# Patient Record
Sex: Female | Born: 1975 | Race: White | Hispanic: No | Marital: Married | Smoking: Former smoker
Health system: Southern US, Community
[De-identification: ages and names within clinical notes are randomized; demographics above are authoritative.]

## PROBLEM LIST (undated history)

## (undated) DIAGNOSIS — K219 Gastro-esophageal reflux disease without esophagitis: Secondary | ICD-10-CM

## (undated) DIAGNOSIS — B379 Candidiasis, unspecified: Secondary | ICD-10-CM

## (undated) DIAGNOSIS — Z8619 Personal history of other infectious and parasitic diseases: Secondary | ICD-10-CM

## (undated) DIAGNOSIS — Z9889 Other specified postprocedural states: Secondary | ICD-10-CM

## (undated) DIAGNOSIS — O409XX0 Polyhydramnios, unspecified trimester, not applicable or unspecified: Secondary | ICD-10-CM

## (undated) DIAGNOSIS — O3440 Maternal care for other abnormalities of cervix, unspecified trimester: Secondary | ICD-10-CM

## (undated) HISTORY — DX: Gastro-esophageal reflux disease without esophagitis: K21.9

## (undated) HISTORY — DX: Maternal care for other abnormalities of cervix, unspecified trimester: Z98.890

## (undated) HISTORY — DX: Maternal care for other abnormalities of cervix, unspecified trimester: O34.40

## (undated) HISTORY — DX: Personal history of other infectious and parasitic diseases: Z86.19

## (undated) HISTORY — DX: Polyhydramnios, unspecified trimester, not applicable or unspecified: O40.9XX0

## (undated) HISTORY — DX: Candidiasis, unspecified: B37.9

---

## 2002-02-08 HISTORY — PX: DILATION AND CURETTAGE OF UTERUS: SHX78

## 2003-03-12 HISTORY — PX: PILONIDAL CYST EXCISION: SHX744

## 2006-06-14 ENCOUNTER — Other Ambulatory Visit: Admission: RE | Admit: 2006-06-14 | Discharge: 2006-06-14 | Payer: Self-pay | Admitting: Obstetrics and Gynecology

## 2007-06-30 ENCOUNTER — Other Ambulatory Visit: Admission: RE | Admit: 2007-06-30 | Discharge: 2007-06-30 | Payer: Self-pay | Admitting: Obstetrics and Gynecology

## 2008-05-06 ENCOUNTER — Ambulatory Visit: Payer: Self-pay | Admitting: Family Medicine

## 2008-05-06 DIAGNOSIS — K219 Gastro-esophageal reflux disease without esophagitis: Secondary | ICD-10-CM

## 2008-05-06 DIAGNOSIS — K644 Residual hemorrhoidal skin tags: Secondary | ICD-10-CM | POA: Insufficient documentation

## 2008-05-09 LAB — CONVERTED CEMR LAB
AST: 26 units/L (ref 0–37)
Albumin: 4.3 g/dL (ref 3.5–5.2)
BUN: 11 mg/dL (ref 6–23)
Basophils Absolute: 0.1 10*3/uL (ref 0.0–0.1)
CO2: 29 meq/L (ref 19–32)
Chloride: 106 meq/L (ref 96–112)
Glucose, Bld: 100 mg/dL — ABNORMAL HIGH (ref 70–99)
HCT: 38.9 % (ref 36.0–46.0)
Hemoglobin: 13.3 g/dL (ref 12.0–15.0)
Lymphs Abs: 3.5 10*3/uL (ref 0.7–4.0)
MCHC: 34.3 g/dL (ref 30.0–36.0)
MCV: 90.6 fL (ref 78.0–100.0)
Monocytes Absolute: 1 10*3/uL (ref 0.1–1.0)
Monocytes Relative: 9.6 % (ref 3.0–12.0)
Neutro Abs: 5.4 10*3/uL (ref 1.4–7.7)
Platelets: 293 10*3/uL (ref 150.0–400.0)
Potassium: 3.9 meq/L (ref 3.5–5.1)
RDW: 12.4 % (ref 11.5–14.6)
Sodium: 141 meq/L (ref 135–145)
TSH: 3.81 microintl units/mL (ref 0.35–5.50)
Total Bilirubin: 0.5 mg/dL (ref 0.3–1.2)

## 2008-07-02 ENCOUNTER — Inpatient Hospital Stay (HOSPITAL_COMMUNITY): Admission: AD | Admit: 2008-07-02 | Discharge: 2008-07-02 | Payer: Self-pay | Admitting: Obstetrics and Gynecology

## 2008-09-24 ENCOUNTER — Encounter: Admission: RE | Admit: 2008-09-24 | Discharge: 2008-09-24 | Payer: Self-pay | Admitting: Obstetrics and Gynecology

## 2008-10-26 ENCOUNTER — Ambulatory Visit (HOSPITAL_COMMUNITY): Admission: AD | Admit: 2008-10-26 | Discharge: 2008-10-26 | Payer: Self-pay | Admitting: Obstetrics and Gynecology

## 2008-11-21 ENCOUNTER — Encounter (INDEPENDENT_AMBULATORY_CARE_PROVIDER_SITE_OTHER): Payer: Self-pay | Admitting: Plastic Surgery

## 2008-11-21 ENCOUNTER — Ambulatory Visit (HOSPITAL_BASED_OUTPATIENT_CLINIC_OR_DEPARTMENT_OTHER): Admission: RE | Admit: 2008-11-21 | Discharge: 2008-11-21 | Payer: Self-pay | Admitting: Plastic Surgery

## 2009-01-17 ENCOUNTER — Inpatient Hospital Stay (HOSPITAL_COMMUNITY): Admission: AD | Admit: 2009-01-17 | Discharge: 2009-01-22 | Payer: Self-pay | Admitting: Obstetrics and Gynecology

## 2009-03-03 ENCOUNTER — Ambulatory Visit: Admission: RE | Admit: 2009-03-03 | Discharge: 2009-03-03 | Payer: Self-pay | Admitting: Obstetrics and Gynecology

## 2009-06-16 ENCOUNTER — Ambulatory Visit: Payer: Self-pay | Admitting: Family Medicine

## 2009-06-16 DIAGNOSIS — M6740 Ganglion, unspecified site: Secondary | ICD-10-CM | POA: Insufficient documentation

## 2010-03-11 NOTE — Assessment & Plan Note (Signed)
Summary: ?cyst in rt wrist/causing discomfort/cjr   Vital Signs:  Patient profile:   35 year old female Weight:      225 pounds BMI:     35.90 BP sitting:   120 / 88  (left arm) Cuff size:   regular  Vitals Entered By: Raechel Ache, RN (Jun 16, 2009 3:45 PM) CC: C/o ?cyst R hand and whole hand hurts.   History of Present Illness: Here for a painful lump on the right wrist which has been present for about 3 months. It is slowly getting larger, and it restricts her wrist movements. No hx of trauma.   Allergies: No Known Drug Allergies  Past History:  Past Medical History: Reviewed history from 05/06/2008 and no changes required. Chickenpox GERD sees the [facility] for GYN exams sees Carma Leaven for dermatology exams  Past Surgical History: Reviewed history from 05/06/2008 and no changes required. D&C  Pilonidal cyst  Excision 03-2003  Review of Systems  The patient denies anorexia, fever, weight loss, weight gain, vision loss, decreased hearing, hoarseness, chest pain, syncope, dyspnea on exertion, peripheral edema, prolonged cough, headaches, hemoptysis, abdominal pain, melena, hematochezia, severe indigestion/heartburn, hematuria, incontinence, genital sores, muscle weakness, suspicious skin lesions, transient blindness, difficulty walking, depression, unusual weight change, abnormal bleeding, enlarged lymph nodes, angioedema, breast masses, and testicular masses.    Physical Exam  General:  Well-developed,well-nourished,in no acute distress; alert,appropriate and cooperative throughout examination Extremities:  the right dorsal wrist has a well defined round tender cyst    Impression & Recommendations:  Problem # 1:  GANGLION CYST (ICD-727.43)  Orders: Surgical Referral (Surgery)  Complete Medication List: 1)  Ortho-novum 7/7/7 (28) 0.5/0.75/1-35 Mg-mcg Tabs (Norethin-eth estrad triphasic)  Patient Instructions: 1)  we will refer to Hand Surgery to remove  this

## 2010-05-12 LAB — CBC
HCT: 39.2 % (ref 36.0–46.0)
Hemoglobin: 10.4 g/dL — ABNORMAL LOW (ref 12.0–15.0)
Hemoglobin: 13.1 g/dL (ref 12.0–15.0)
MCHC: 33.6 g/dL (ref 30.0–36.0)
MCV: 91.9 fL (ref 78.0–100.0)
Platelets: 206 10*3/uL (ref 150–400)
RBC: 3.38 MIL/uL — ABNORMAL LOW (ref 3.87–5.11)
RDW: 15.2 % (ref 11.5–15.5)
WBC: 11.1 10*3/uL — ABNORMAL HIGH (ref 4.0–10.5)
WBC: 17.6 10*3/uL — ABNORMAL HIGH (ref 4.0–10.5)

## 2010-05-15 LAB — ANTIBODY SCREEN: Antibody Screen: NEGATIVE

## 2010-05-15 LAB — RH IMMUNE GLOBULIN WORKUP (NOT WOMEN'S HOSP): ABO/RH(D): A NEG

## 2010-09-01 ENCOUNTER — Encounter: Payer: Self-pay | Admitting: Family Medicine

## 2010-09-02 ENCOUNTER — Ambulatory Visit (INDEPENDENT_AMBULATORY_CARE_PROVIDER_SITE_OTHER): Payer: BC Managed Care – PPO | Admitting: Family Medicine

## 2010-09-02 ENCOUNTER — Encounter: Payer: Self-pay | Admitting: Family Medicine

## 2010-09-02 VITALS — BP 118/88 | HR 91 | Temp 98.5°F | Wt 234.0 lb

## 2010-09-02 DIAGNOSIS — L219 Seborrheic dermatitis, unspecified: Secondary | ICD-10-CM

## 2010-09-02 NOTE — Progress Notes (Signed)
  Subjective:    Patient ID: Cathy Pearson, female    DOB: [birth year], 35 y.o.   MRN: [id number]  HPI Here for an itchy rash that comes and goes on her scalp. This has been going on for about a year. She does not use medicated shampoos. Her scalp gets red at times and flakes, then it seems to clear up. No other areas on her skin are involved.    Review of Systems  Constitutional: Negative.   Skin: Positive for rash.       Objective:   Physical Exam  Constitutional: She appears well-developed and well-nourished.  Skin: Skin is warm. No rash noted. No erythema. No pallor.       Her scalp is clear          Assessment & Plan:  This sounds like it may be seborrhea. Suggested she try Selsun Blue shampoo. Recheck prn

## 2011-03-10 ENCOUNTER — Encounter: Payer: Self-pay | Admitting: *Deleted

## 2011-03-16 ENCOUNTER — Ambulatory Visit (INDEPENDENT_AMBULATORY_CARE_PROVIDER_SITE_OTHER): Payer: BC Managed Care – PPO | Admitting: Endocrinology

## 2011-03-16 ENCOUNTER — Encounter: Payer: Self-pay | Admitting: Endocrinology

## 2011-03-16 ENCOUNTER — Other Ambulatory Visit (INDEPENDENT_AMBULATORY_CARE_PROVIDER_SITE_OTHER): Payer: BC Managed Care – PPO

## 2011-03-16 VITALS — BP 122/70 | HR 62 | Temp 98.6°F | Ht 67.0 in | Wt 238.0 lb

## 2011-03-16 DIAGNOSIS — R7989 Other specified abnormal findings of blood chemistry: Secondary | ICD-10-CM

## 2011-03-16 DIAGNOSIS — E785 Hyperlipidemia, unspecified: Secondary | ICD-10-CM

## 2011-03-16 DIAGNOSIS — R799 Abnormal finding of blood chemistry, unspecified: Secondary | ICD-10-CM

## 2011-03-16 DIAGNOSIS — E079 Disorder of thyroid, unspecified: Secondary | ICD-10-CM | POA: Insufficient documentation

## 2011-03-16 NOTE — Progress Notes (Signed)
Subjective:    Patient ID: Cathy Pearson, female    DOB: 05/19/1975, 36 y.o.   MRN: [id number]  HPI Pt states few years of slight hair loss throughout the head, and assoc fatigue.  She says thyroid levels have been normal to slightly abnormal in the past.   She says she is not at risk for another pregnancy. Past Medical History  Diagnosis Date  . GERD (gastroesophageal reflux disease)     Past Surgical History  Procedure Date  . Dilation and curettage of uterus   . Pilonidal cyst excision 03-2003    History   Social History  . Marital Status: Married    Spouse Name: N/A    Number of Children: N/A  . Years of Education: N/A   Occupational History  . Not on file.   Social History Main Topics  . Smoking status: Former Games developer  . Smokeless tobacco: Not on file  . Alcohol Use: Yes  . Drug Use: No  . Sexually Active:    Other Topics Concern  . Not on file   Social History Narrative   Regular exercise-yes    Current Outpatient Prescriptions on File Prior to Visit  Medication Sig Dispense Refill  . norethindrone-ethinyl estradiol (TRIPHASIL) 0.5/0.75/1-35 MG-MCG per tablet Take 1 tablet by mouth daily.          No Known Allergies  Family History  Problem Relation Age of Onset  . Alcohol abuse      fhx  . Cancer      fhx/breast, lung, uterine  . Coronary artery disease      fhx  . Depression      fhx  . Diabetes      fhx  . Hypertension      fhx  . Hyperlipidemia      fhx  . Kidney disease      fhx  . Thyroid disease      fhx  hashimoto's thyroiditis:  Father and multiple aunts/uncles  BP 122/70  Pulse 62  Temp(Src) 98.6 F (37 C) (Oral)  Ht 5\' 7"  (1.702 m)  Wt 238 lb (107.956 kg)  BMI 37.28 kg/m2  SpO2 99%  Review of Systems denies cramps, sob, memory loss, numbness, blurry vision, dry skin, rhinorrhea, and syncope.  She reports irritability, depression, constipation, myalgias, dry skin, easy bruising, and weight gain.      Objective:   Physical Exam VS: see vs page GEN: no distress HEAD: head: no deformity eyes: no periorbital swelling, no proptosis external nose and ears are normal mouth: no lesion seen NECK: supple, thyroid is not enlarged CHEST WALL: no deformity LUNGS:  Clear to auscultation CV: reg rate and rhythm, no murmur ABD: abdomen is soft, nontender.  no hepatosplenomegaly.  not distended.  no hernia. MUSCULOSKELETAL: muscle bulk and strength are grossly normal.  no obvious joint swelling.  gait is normal and steady EXTEMITIES: no deformity of the hands.  no edema of the legs NEURO:  cn 2-12 grossly intact.   readily moves all 4's.  sensation is intact to touch on all 4's. No tremor. SKIN:  Normal texture and temperature.  No rash or suspicious lesion is visible.   NODES:  None palpable at the neck. PSYCH: alert, oriented x3.  Does not appear anxious nor depressed.   Lab Results  Component Value Date   TSH 2.50 03/16/2011   Lab Results  Component Value Date   CHOL 255* 03/16/2011   HDL 52.40 03/16/2011   LDLDIRECT 175.9 03/16/2011  TRIG 165.0* 03/16/2011   CHOLHDL 5 03/16/2011   Lab Results  Component Value Date   HGBA1C 5.4 03/16/2011      Assessment & Plan:  Apparent h/o mild hypothyroidism, better now.  However, she is at risk for hypothyroidism in the future Hair loss, not thyroid-related Dyslipidemia, new

## 2011-03-16 NOTE — Patient Instructions (Addendum)
cc Dr Stefano Gaul.  blood tests are being requested for you today.  please call [phone] to hear your test results.  You will be prompted to enter the 9-digit "MRN" number that appears at the top left of this page, followed by #.  Then you will hear the message.   (update: i left message on phone-tree:  You should have tsh rechecked in 1 year.  i advised chol med)

## 2011-03-17 DIAGNOSIS — E785 Hyperlipidemia, unspecified: Secondary | ICD-10-CM | POA: Insufficient documentation

## 2011-03-17 LAB — LIPID PANEL
HDL: 52.4 mg/dL (ref >39.00)
Total CHOL/HDL Ratio: 5
Triglycerides: 165 mg/dL — ABNORMAL HIGH (ref 0.0–149.0)
VLDL: 33 mg/dL (ref 0.0–40.0)

## 2011-03-17 LAB — LDL CHOLESTEROL, DIRECT: Direct LDL: 175.9 mg/dL

## 2011-08-26 ENCOUNTER — Ambulatory Visit (INDEPENDENT_AMBULATORY_CARE_PROVIDER_SITE_OTHER): Payer: 59 | Admitting: Obstetrics and Gynecology

## 2011-08-26 ENCOUNTER — Encounter: Payer: Self-pay | Admitting: Obstetrics and Gynecology

## 2011-08-26 VITALS — BP 126/64 | Resp 14 | Ht 68.0 in | Wt 238.0 lb

## 2011-08-26 DIAGNOSIS — Z124 Encounter for screening for malignant neoplasm of cervix: Secondary | ICD-10-CM

## 2011-08-26 DIAGNOSIS — Z01419 Encounter for gynecological examination (general) (routine) without abnormal findings: Secondary | ICD-10-CM

## 2011-08-26 DIAGNOSIS — E669 Obesity, unspecified: Secondary | ICD-10-CM | POA: Insufficient documentation

## 2011-08-26 NOTE — Progress Notes (Signed)
Regular Periods: yes Mammogram: no  Monthly Breast Ex.: yes Exercise: yes  Tetanus < 10 years: no Seatbelts: yes  NI. Bladder Functn.: yes Abuse at home: no  Daily BM's: yes Stressful Work: no  Healthy Diet: yes Sigmoid-Colonoscopy: N/A  Calcium: no Medical problems this year: N/A   LAST PAP:06/2008" WNL"  Contraception: Ortho- Novum  Mammogram:  N/A  PCP: Dr.Steven Abran Cantor  PMH: No Changes  FMH: No Changes  Last Bone Scan: N/A  ANNUAL GYNECOLOGIC EXAMINATION   Cathy Pearson is a 36 y.o. female, G1P1001, who presents for an annual exam. See above. Doing well.  Prior Hysterectomy: No    History   Social History  . Marital Status: Married    Spouse Name: N/A    Number of Children: N/A  . Years of Education: N/A   Social History Main Topics  . Smoking status: Former Games developer  . Smokeless tobacco: Never Used  . Alcohol Use: Yes  . Drug Use: No  . Sexually Active: yes   Other Topics Concern  . None   Social History Narrative   Regular exercise-yes    Menstrual cycle:   LMP: Patient's last menstrual period was 08/08/2011.           Cycle: Regular, monthly with normal flow and no severe dysmenorrha  The following portions of the patient's history were reviewed and updated as appropriate: allergies, current medications, past family history, past medical history, past social history, past surgical history and problem list.  Review of Systems Pertinent items are noted in HPI. Breast:Negative for breast lump,nipple discharge or nipple retraction Gastrointestinal: Negative for abdominal pain, change in bowel habits or rectal bleeding Urinary:negative   Objective:    BP 126/64  Resp 14  Ht 5\' 8"  (1.727 m)  Wt 238 lb (107.956 kg)  BMI 36.19 kg/m2  LMP 08/08/2011    Weight:  Wt Readings from Last 1 Encounters:  08/26/11 238 lb (107.956 kg)          BMI: Body mass index is 36.19 kg/(m^2).  General Appearance: Alert, appropriate appearance for age. No acute  distress HEENT: Grossly normal Neck / Thyroid: Supple, no masses, nodes or enlargement Lungs: clear to auscultation bilaterally Back: No CVA tenderness Breast Exam: No masses or nodes.No dimpling, nipple retraction or discharge. Cardiovascular: Regular rate and rhythm. S1, S2, no murmur Gastrointestinal: Soft, non-tender, no masses or organomegaly  ++++++++++++++++++++++++++++++++++++++++++++++++++++++++  Pelvic Exam: External genitalia: normal general appearance Vaginal: normal without tenderness, induration or masses and relaxation: Yes Cervix: normal appearance Adnexa: normal bimanual exam Uterus: normal size, shape, and consistency Rectovaginal: normal rectal, no masses  ++++++++++++++++++++++++++++++++++++++++++++++++++++++++  Lymphatic Exam: Non-palpable nodes in neck, clavicular, axillary, or inguinal regions Neurologic: Normal speech, no tremor  Psychiatric: Alert and oriented, appropriate affect.   Wet Prep:   not applicable Urinalysis:  not applicable UPT:           Not done   Assessment:    Normal gyn exam   Overweight or obese: Yes   Pelvic relaxation: Yes     Plan:    pap smear return annually or prn Contraception:vasectomy    Medications prescribed: none  STD screen request: none  RPR: No.   HBsAg: No.  Hepatitis C: No.  The updated Pap smear screening guidelines were discussed with the patient. The patient requested that I obtain a Pap smear: Yes.  Kegel exercises discussed: Yes.  Proper diet and regular exercise were reviewed.  Annual mammograms recommended starting at age 98. Proper  breast care was discussed.  Screening colonoscopy is recommended beginning at age 67.  Regular health maintenance was reviewed.  Sleep hygiene was discussed.  Adequate calcium and vitamin D intake was emphasized.  Mylinda Latina.D.

## 2012-04-20 ENCOUNTER — Ambulatory Visit (INDEPENDENT_AMBULATORY_CARE_PROVIDER_SITE_OTHER): Payer: 59 | Admitting: Family Medicine

## 2012-04-20 DIAGNOSIS — Z23 Encounter for immunization: Secondary | ICD-10-CM

## 2013-03-13 ENCOUNTER — Encounter: Payer: Self-pay | Admitting: Family Medicine

## 2013-03-13 ENCOUNTER — Ambulatory Visit (INDEPENDENT_AMBULATORY_CARE_PROVIDER_SITE_OTHER): Payer: 59 | Admitting: Family Medicine

## 2013-03-13 VITALS — BP 106/70 | HR 63 | Temp 98.6°F | Ht 68.0 in | Wt 232.0 lb

## 2013-03-13 DIAGNOSIS — K644 Residual hemorrhoidal skin tags: Secondary | ICD-10-CM

## 2013-03-13 DIAGNOSIS — H0289 Other specified disorders of eyelid: Secondary | ICD-10-CM

## 2013-03-13 MED ORDER — HYDROCORTISONE 2.5 % RE CREA: 1.0000 "application " | TOPICAL_CREAM | Freq: Two times a day (BID) | RECTAL | Status: DC

## 2013-03-13 MED ORDER — NEOMYCIN-POLYMYXIN-HC 3.5-10000-1 OP SUSP: 3.0000 [drp] | Freq: Four times a day (QID) | OPHTHALMIC | Status: DC

## 2013-03-13 NOTE — Progress Notes (Signed)
   Subjective:    Patient ID: Alleen Borneharity Cuello, female    DOB: Jul 02, 1975, 38 y.o.   MRN: [id number]  HPI Here for 2 things. First she asks for a cream to sooth her hemorrhoids. Second for the past 10 days she has had irritation with some redness in the corner of the right eye. No DC and no eyelids sticking together. Her vision is okay. She does not wear contact lenses. No cold or URI sx.    Review of Systems  Constitutional: Negative.   Eyes: Positive for pain and redness. Negative for discharge and visual disturbance.  Respiratory: Negative.        Objective:   Physical Exam  Constitutional: She appears well-developed and well-nourished.  HENT:  Right Ear: External ear normal.  Left Ear: External ear normal.  Nose: Nose normal.  Mouth/Throat: Oropharynx is clear and moist.  Eyes: Conjunctivae are normal. Pupils are equal, round, and reactive to light. Right eye exhibits no discharge. Left eye exhibits discharge.  The nasal corner of the right eye shows some erythema, no swelling or DC          Assessment & Plan:  Eye irritation, possibly due to a loose eyelash. Try Cortisporin drops. Try Anusol HC for the hemorrhoids .

## 2013-03-13 NOTE — Progress Notes (Signed)
Pre visit review using our clinic review tool, if applicable. No additional management support is needed unless otherwise documented below in the visit note. 

## 2013-11-01 ENCOUNTER — Ambulatory Visit (INDEPENDENT_AMBULATORY_CARE_PROVIDER_SITE_OTHER): Payer: BC Managed Care – PPO | Admitting: Physician Assistant

## 2013-11-01 ENCOUNTER — Encounter: Payer: Self-pay | Admitting: Physician Assistant

## 2013-11-01 VITALS — BP 110/80 | HR 93 | Temp 99.1°F | Resp 18 | Wt 251.8 lb

## 2013-11-01 DIAGNOSIS — J209 Acute bronchitis, unspecified: Secondary | ICD-10-CM

## 2013-11-01 MED ORDER — AZITHROMYCIN 250 MG PO TABS: ORAL_TABLET | ORAL | Status: DC

## 2013-11-01 NOTE — Patient Instructions (Addendum)
Plain Over the Counter Mucinex (NOT Mucinex D) for thick secretions  Force NON dairy fluids, drinking plenty of water is best.    Over the Counter Flonase OR Nasacort AQ 1 spray in each nostril twice a day as needed. Use the "crossover" technique into opposite nostril spraying toward opposite ear @ 45 degree angle, not straight up into nostril.   Plain Over the Counter Allegra (NOT D )  160 daily , OR Loratidine 10 mg , OR Zyrtec 10 mg @ bedtime  as needed for itchy eyes & sneezing.  Saline Irrigation and Saline Sprays can also help reduce symptoms.  Azithromycin as directed if your symptoms do not improve through the weekend despite symptomatic treatment.  If emergency symptoms discussed during visit developed, seek medical attention immediately.  Followup as needed, or for worsening or persistent symptoms despite treatment.    Upper Respiratory Infection, Adult An upper respiratory infection (URI) is also known as the common cold. It is often caused by a type of germ (virus). Colds are easily spread (contagious). You can pass it to others by kissing, coughing, sneezing, or drinking out of the same glass. Usually, you get better in 1 or 2 weeks.  HOME CARE   Only take medicine as told by your doctor.  Use a warm mist humidifier or breathe in steam from a hot shower.  Drink enough water and fluids to keep your pee (urine) clear or pale yellow.  Get plenty of rest.  Return to work when your temperature is back to normal or as told by your doctor. You may use a face mask and wash your hands to stop your cold from spreading. GET HELP RIGHT AWAY IF:   After the first few days, you feel you are getting worse.  You have questions about your medicine.  You have chills, shortness of breath, or brown or red spit (mucus).  You have yellow or brown snot (nasal discharge) or pain in the face, especially when you bend forward.  You have a fever, puffy (swollen) neck, pain when you  swallow, or white spots in the back of your throat.  You have a bad headache, ear pain, sinus pain, or chest pain.  You have a high-pitched whistling sound when you breathe in and out (wheezing).  You have a lasting cough or cough up blood.  You have sore muscles or a stiff neck. MAKE SURE YOU:   Understand these instructions.  Will watch your condition.  Will get help right away if you are not doing well or get worse. Document Released: 07/14/2007 Document Revised: 04/19/2011 Document Reviewed: 05/02/2013 [facility] Patient Information 2015 [place], [place]. This information is not intended to replace advice given to you by your health care provider. Make sure you discuss any questions you have with your health care provider.

## 2013-11-01 NOTE — Progress Notes (Signed)
Subjective:    Patient ID: Cathy Pearson, female    DOB: 1975-11-20, 38 y.o.   MRN: [id number]  Cough This is a new problem. The current episode started in the past 7 days (2 days). The problem has been gradually worsening. The problem occurs every few minutes. The cough is productive of sputum. Associated symptoms include chills, a fever, headaches, nasal congestion, postnasal drip and a sore throat. Pertinent negatives include no chest pain, ear congestion, ear pain, heartburn, hemoptysis, myalgias, rash, rhinorrhea, shortness of breath, sweats, weight loss or wheezing. Nothing aggravates the symptoms. Treatments tried: mucinex, delsym, nyquil. The treatment provided mild relief. There is no history of asthma, COPD or environmental allergies.      Review of Systems  Constitutional: Positive for fever and chills. Negative for weight loss.  HENT: Positive for congestion, postnasal drip, sinus pressure and sore throat. Negative for ear pain and rhinorrhea.   Respiratory: Positive for cough. Negative for hemoptysis, shortness of breath and wheezing.   Cardiovascular: Negative for chest pain.  Gastrointestinal: Negative for heartburn, nausea, vomiting and diarrhea.  Musculoskeletal: Negative for myalgias.  Skin: Negative for rash.  Allergic/Immunologic: Negative for environmental allergies.  Neurological: Positive for headaches. Negative for syncope.  All other systems reviewed and are negative.    Past Medical History  Diagnosis Date  . GERD (gastroesophageal reflux disease)   . History of chicken pox   . Yeast infection   . Polyhydramnios   . H/O LEEP (loop electrosurgical excision procedure) of cervix complicating pregnancy     History   Social History  . Marital Status: Married    Spouse Name: N/A    Number of Children: N/A  . Years of Education: N/A   Occupational History  . Not on file.   Social History Main Topics  . Smoking status: Former Games developer  . Smokeless  tobacco: Never Used  . Alcohol Use: Yes     Comment: occ  . Drug Use: No  . Sexual Activity: Not on file   Other Topics Concern  . Not on file   Social History Narrative   Regular exercise-yes    Past Surgical History  Procedure Laterality Date  . Dilation and curettage of uterus  2004  . Pilonidal cyst excision  03-2003    Family History  Problem Relation Age of Onset  . Alcohol abuse      fhx  . Cancer      fhx/breast, lung, uterine  . Coronary artery disease      fhx  . Depression      fhx  . Diabetes      fhx  . Hypertension      fhx  . Hyperlipidemia      fhx  . Kidney disease      fhx  . Thyroid disease      fhx    No Known Allergies  Current Outpatient Prescriptions on File Prior to Visit  Medication Sig Dispense Refill  . hydrocortisone (ANUSOL-HC) 2.5 % rectal cream Place 1 application rectally 2 (two) times daily.  30 g  5  . neomycin-polymyxin-hydrocortisone (CORTISPORIN) 3.5-10000-1 ophthalmic suspension Place 3 drops into the right eye 4 (four) times daily.  7.5 mL  0   No current facility-administered medications on file prior to visit.    EXAM: BP 110/80  Pulse 93  Temp(Src) 99.1 F (37.3 C) (Oral)  Resp 18  Wt 251 lb 12.8 oz (114.216 kg)  SpO2 98%  Objective:   Physical Exam  Nursing note and vitals reviewed. Constitutional: She is oriented to person, place, and time. She appears well-developed and well-nourished. No distress.  HENT:  Head: Normocephalic and atraumatic.  Right Ear: External ear normal.  Left Ear: External ear normal.  Nose: Nose normal.  Mouth/Throat: Oropharynx is clear and moist. No oropharyngeal exudate.  Bilateral TMs normal. Bilateral frontal and maxillary sinuses non-TTP.  Eyes: Conjunctivae and EOM are normal. Pupils are equal, round, and reactive to light.  Neck: Normal range of motion. Neck supple.  Cardiovascular: Normal rate, regular rhythm and intact distal pulses.   Pulmonary/Chest: Effort  normal and breath sounds normal. No stridor. No respiratory distress. She has no wheezes. She has no rales. She exhibits no tenderness.  Lymphadenopathy:    She has no cervical adenopathy.  Neurological: She is alert and oriented to person, place, and time.  Skin: Skin is warm and dry. No rash noted. She is not diaphoretic. No erythema. No pallor.  Psychiatric: She has a normal mood and affect. Her behavior is normal. Judgment and thought content normal.    Lab Results  Component Value Date   WBC 17.6* 01/20/2009   HGB 10.4* 01/20/2009   HCT 31.0* 01/20/2009   PLT 158 01/20/2009   GLUCOSE 100* 05/06/2008   CHOL 255* 03/16/2011   TRIG 165.0* 03/16/2011   HDL 52.40 03/16/2011   LDLDIRECT 175.9 03/16/2011   ALT 33 05/06/2008   AST 26 05/06/2008   NA 141 05/06/2008   K 3.9 05/06/2008   CL 106 05/06/2008   CREATININE 0.8 05/06/2008   BUN 11 05/06/2008   CO2 29 05/06/2008   TSH 2.50 03/16/2011   HGBA1C 5.4 03/16/2011         Assessment & Plan:  Cathy Pearson was seen today for cough.  Diagnoses and associated orders for this visit:  Acute bronchitis, unspecified organism Comments: Symptomatic treatment with OTC Mucinex, nasal steroid, antihistamine, rest, push fluids, watchful waiting. - Discontinue: azithromycin (ZITHROMAX) 250 MG tablet; 2 tabs on first day, followed by 1 tab on days 2 through 5. - azithromycin (ZITHROMAX) 250 MG tablet; 2 tabs on first day, followed by 1 tab on days 2 through 5.   Discussed with patient that antibiotics were likely not indicated, that her condition was likely viral, and that an antibiotic would not likely help. The patient still felt as though an antibiotic would be good to have in case he is not better by the weekend. Provided a printed Rx of a Z-Pak for patient to refill this weekend if she is still not better despite symptomatic treatment. The patient is amenable to this plan.  Return precautions provided, and patient handout on URI.  Plan to follow up as  needed, or for worsening or persistent symptoms despite treatment.  Patient Instructions  Plain Over the Counter Mucinex (NOT Mucinex D) for thick secretions  Force NON dairy fluids, drinking plenty of water is best.    Over the Counter Flonase OR Nasacort AQ 1 spray in each nostril twice a day as needed. Use the "crossover" technique into opposite nostril spraying toward opposite ear @ 45 degree angle, not straight up into nostril.   Plain Over the Counter Allegra (NOT D )  160 daily , OR Loratidine 10 mg , OR Zyrtec 10 mg @ bedtime  as needed for itchy eyes & sneezing.  Saline Irrigation and Saline Sprays can also help reduce symptoms.  Azithromycin as directed if your symptoms do not improve through  the weekend despite symptomatic treatment.  If emergency symptoms discussed during visit developed, seek medical attention immediately.  Followup as needed, or for worsening or persistent symptoms despite treatment.

## 2013-11-01 NOTE — Progress Notes (Signed)
Pre visit review using our clinic review tool, if applicable. No additional management support is needed unless otherwise documented below in the visit note. 

## 2013-12-10 ENCOUNTER — Encounter: Payer: Self-pay | Admitting: Physician Assistant

## 2014-09-10 ENCOUNTER — Encounter: Payer: Self-pay | Admitting: Family Medicine

## 2014-09-10 ENCOUNTER — Ambulatory Visit (INDEPENDENT_AMBULATORY_CARE_PROVIDER_SITE_OTHER): Payer: 59 | Admitting: Family Medicine

## 2014-09-10 VITALS — BP 113/77 | HR 72 | Temp 98.8°F | Ht 68.0 in | Wt 253.0 lb

## 2014-09-10 DIAGNOSIS — Z23 Encounter for immunization: Secondary | ICD-10-CM | POA: Diagnosis not present

## 2014-09-10 DIAGNOSIS — Z0000 Encounter for general adult medical examination without abnormal findings: Secondary | ICD-10-CM

## 2014-09-10 NOTE — Progress Notes (Signed)
   Subjective:    Patient ID: Cathy Pearson, female    DOB: May 05, 1975, 39 y.o.   MRN: [id number]  HPI 39 yr old female for a work related physical. She will work for [employer] in the [employer] center at [employer]. She feels well and has no concerns. She sees her GYN yearly.    Review of Systems  Constitutional: Negative.   HENT: Negative.   Eyes: Negative.   Respiratory: Negative.   Cardiovascular: Negative.   Gastrointestinal: Negative.   Genitourinary: Negative for dysuria, urgency, frequency, hematuria, flank pain, decreased urine volume, enuresis, difficulty urinating, pelvic pain and dyspareunia.  Musculoskeletal: Negative.   Skin: Negative.   Neurological: Negative.   Psychiatric/Behavioral: Negative.        Objective:   Physical Exam  Constitutional: She is oriented to person, place, and time. She appears well-developed and well-nourished. No distress.  HENT:  Head: Normocephalic and atraumatic.  Right Ear: External ear normal.  Left Ear: External ear normal.  Nose: Nose normal.  Mouth/Throat: Oropharynx is clear and moist. No oropharyngeal exudate.  Eyes: Conjunctivae and EOM are normal. Pupils are equal, round, and reactive to light. No scleral icterus.  Neck: Normal range of motion. Neck supple. No JVD present. No thyromegaly present.  Cardiovascular: Normal rate, regular rhythm, normal heart sounds and intact distal pulses.  Exam reveals no gallop and no friction rub.   No murmur heard. Pulmonary/Chest: Effort normal and breath sounds normal. No respiratory distress. She has no wheezes. She has no rales. She exhibits no tenderness.  Abdominal: Soft. Bowel sounds are normal. She exhibits no distension and no mass. There is no tenderness. There is no rebound and no guarding.  Musculoskeletal: Normal range of motion. She exhibits no edema or tenderness.  Lymphadenopathy:    She has no cervical adenopathy.  Neurological: She is alert and  oriented to person, place, and time. She has normal reflexes. No cranial nerve deficit. She exhibits normal muscle tone. Coordination normal.  Skin: Skin is warm and dry. No rash noted. No erythema.  Psychiatric: She has a normal mood and affect. Her behavior is normal. Judgment and thought content normal.          Assessment & Plan:  Well exam. We discussed diet and exercise advice, and I urged her to lose some weight. She is cleared to work, pending the result of her PPD test.

## 2014-09-10 NOTE — Progress Notes (Signed)
Pre visit review using our clinic review tool, if applicable. No additional management support is needed unless otherwise documented below in the visit note. 

## 2014-09-12 LAB — TB SKIN TEST
INDURATION: 0 mm
TB Skin Test: NEGATIVE

## 2016-04-20 ENCOUNTER — Ambulatory Visit (INDEPENDENT_AMBULATORY_CARE_PROVIDER_SITE_OTHER): Payer: BC Managed Care – PPO | Admitting: Family Medicine

## 2016-04-20 ENCOUNTER — Encounter: Payer: Self-pay | Admitting: Family Medicine

## 2016-04-20 VITALS — BP 125/83 | HR 63 | Temp 98.4°F | Ht 68.0 in | Wt 248.0 lb

## 2016-04-20 DIAGNOSIS — M5432 Sciatica, left side: Secondary | ICD-10-CM

## 2016-04-20 MED ORDER — METHYLPREDNISOLONE 4 MG PO TBPK: ORAL_TABLET | ORAL | 0 refills | Status: DC

## 2016-04-20 MED ORDER — CYCLOBENZAPRINE HCL 10 MG PO TABS: 10.0000 mg | ORAL_TABLET | Freq: Three times a day (TID) | ORAL | 2 refills | Status: DC | PRN

## 2016-04-20 NOTE — Progress Notes (Signed)
Pre visit review using our clinic review tool, if applicable. No additional management support is needed unless otherwise documented below in the visit note. 

## 2016-04-20 NOTE — Patient Instructions (Signed)
WE NOW OFFER   [facility] FAST TRACK!!!  SAME DAY Appointments for ACUTE CARE  Such as: Sprains, Injuries, cuts, abrasions, rashes, muscle pain, joint pain, back pain Colds, flu, sore throats, headache, allergies, cough, fever  Ear pain, sinus and eye infections Abdominal pain, nausea, vomiting, diarrhea, upset stomach Animal/insect bites  3 Easy Ways to Schedule: Walk-In Scheduling Call in scheduling Mychart Sign-up: [email or web address]         

## 2016-04-20 NOTE — Progress Notes (Signed)
   Subjective:    Patient ID: Cathy Pearson, female    DOB: 1975-07-08, 41 y.o.   MRN: [id number]  HPI Here for left sided low back pain that started 3 months ago. At first it was intermittent but it now occurs daily. It also radiates through the left buttock and down the left leg. No weakness or numbness. Using Ibuprofen.    Review of Systems  Constitutional: Negative.   Musculoskeletal: Positive for back pain. Negative for arthralgias and gait problem.       Objective:   Physical Exam  Constitutional: She appears well-developed and well-nourished. No distress.  Musculoskeletal:  She is tender in the left lower back and over the left sciatic notch. SLR is positive on the left. ROM is full.           Assessment & Plan:  Sciatica, use moist heat and stretches. Given a Medrol dose pack and Flexeril. Recheck prn. Gershon CraneStephen Fry, MD

## 2016-04-21 DIAGNOSIS — M5432 Sciatica, left side: Secondary | ICD-10-CM | POA: Insufficient documentation

## 2016-05-05 ENCOUNTER — Telehealth: Payer: Self-pay | Admitting: Family Medicine

## 2016-05-05 DIAGNOSIS — M5440 Lumbago with sciatica, unspecified side: Principal | ICD-10-CM

## 2016-05-05 DIAGNOSIS — G8929 Other chronic pain: Secondary | ICD-10-CM

## 2016-05-05 NOTE — Telephone Encounter (Signed)
I put in orders for her to go to [facility] to have Xrays of the lumbar spine done

## 2016-05-05 NOTE — Telephone Encounter (Signed)
Pt was seen on 04-20-16 and muscle relaxer is not help her. [facility] [address]

## 2016-05-06 NOTE — Telephone Encounter (Signed)
I left a voice message with information and also advised pt to return our call.

## 2016-05-10 ENCOUNTER — Ambulatory Visit (INDEPENDENT_AMBULATORY_CARE_PROVIDER_SITE_OTHER)
Admission: RE | Admit: 2016-05-10 | Discharge: 2016-05-10 | Disposition: A | Discharge status: Home / Self Care | Payer: BC Managed Care – PPO | Source: Ambulatory Visit | Attending: Family Medicine | Admitting: Family Medicine

## 2016-05-10 DIAGNOSIS — M5440 Lumbago with sciatica, unspecified side: Secondary | ICD-10-CM

## 2016-05-10 DIAGNOSIS — G8929 Other chronic pain: Secondary | ICD-10-CM | POA: Diagnosis not present

## 2016-05-10 NOTE — Telephone Encounter (Signed)
I spoke pt and she did get xray done, we are waiting on results.

## 2016-05-12 ENCOUNTER — Other Ambulatory Visit: Payer: Self-pay | Admitting: Family Medicine

## 2016-05-12 MED ORDER — DICLOFENAC SODIUM 75 MG PO TBEC: 75.0000 mg | DELAYED_RELEASE_TABLET | Freq: Two times a day (BID) | ORAL | 5 refills | Status: DC | PRN

## 2016-05-12 MED ORDER — DICLOFENAC SODIUM 75 MG PO TBEC: 75.0000 mg | DELAYED_RELEASE_TABLET | Freq: Two times a day (BID) | ORAL | 5 refills | Status: DC

## 2017-04-20 ENCOUNTER — Telehealth: Payer: Self-pay

## 2017-04-20 NOTE — Telephone Encounter (Signed)
Call in #60 with no rf. She will need an OV soon

## 2017-04-20 NOTE — Telephone Encounter (Signed)
Fax from [facility] [address]   RF request for 90 day supply of diclofenac SOD EC 75 MG   Last OV 04/20/2016   Last refilled 05/12/2016 disp 60 with 5 refills   Sent to PCP for approval

## 2017-04-20 NOTE — Telephone Encounter (Signed)
Called and spoke with pt. Pt stated that she didn't need the medication no need to send this in. Ok to close.

## 2017-04-28 ENCOUNTER — Telehealth: Payer: Self-pay

## 2017-04-28 NOTE — Telephone Encounter (Signed)
Call in #60 with 5 rf 

## 2017-04-28 NOTE — Telephone Encounter (Signed)
Fax from [facility] [address]   Rf request for   Diclofenac 75 MG tablet   Last OV 04/20/2016    Last refilled 05/12/2016 disp 60 with 5 refills   Sent to PCP for approval

## 2017-04-29 MED ORDER — DICLOFENAC SODIUM 75 MG PO TBEC: 75.0000 mg | DELAYED_RELEASE_TABLET | Freq: Two times a day (BID) | ORAL | 5 refills | Status: DC | PRN

## 2017-08-13 IMAGING — DX DG LUMBAR SPINE COMPLETE 4+V
5 series · 5 of 5 positions shown · non-contrast
Comparison: None.

CLINICAL DATA: Chronic low back pain

EXAM:
LUMBAR SPINE - COMPLETE 4+ VIEW

[l-spine ap]
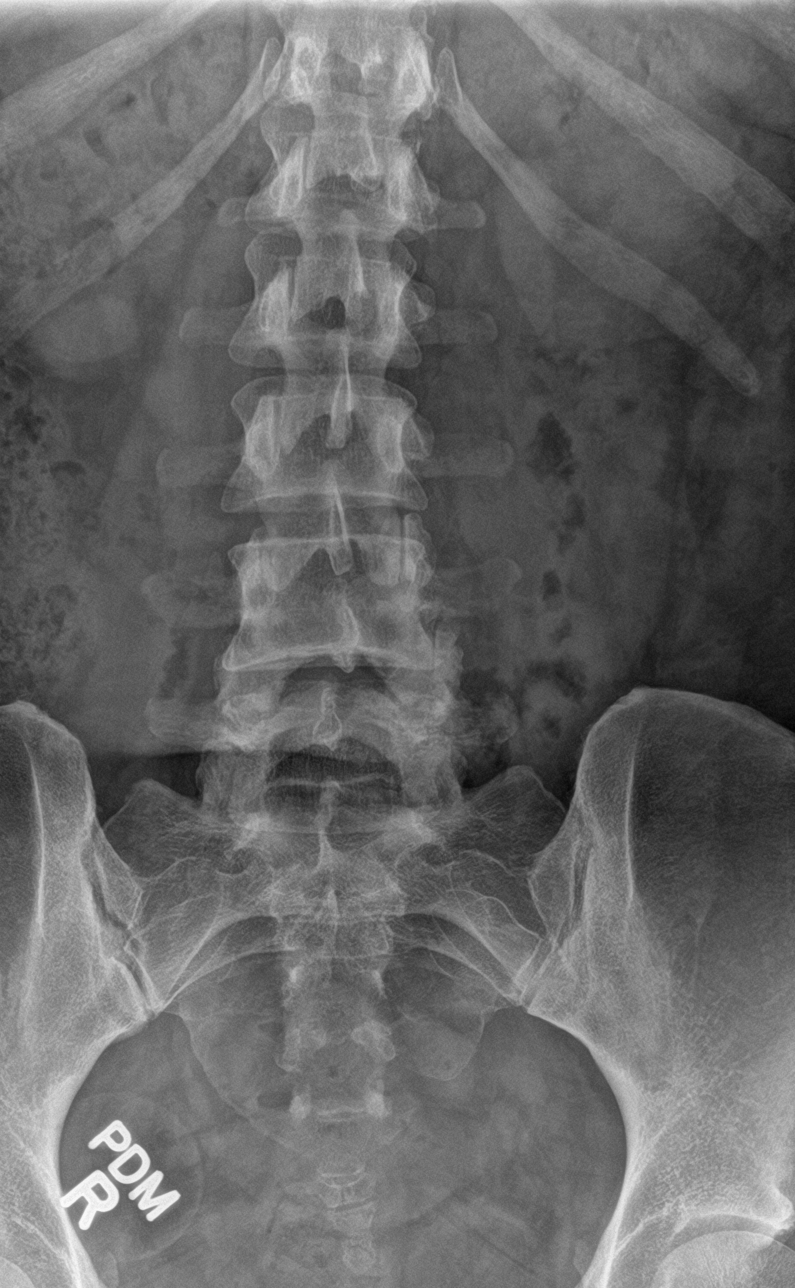

[l-spine obl (1 of 2)]
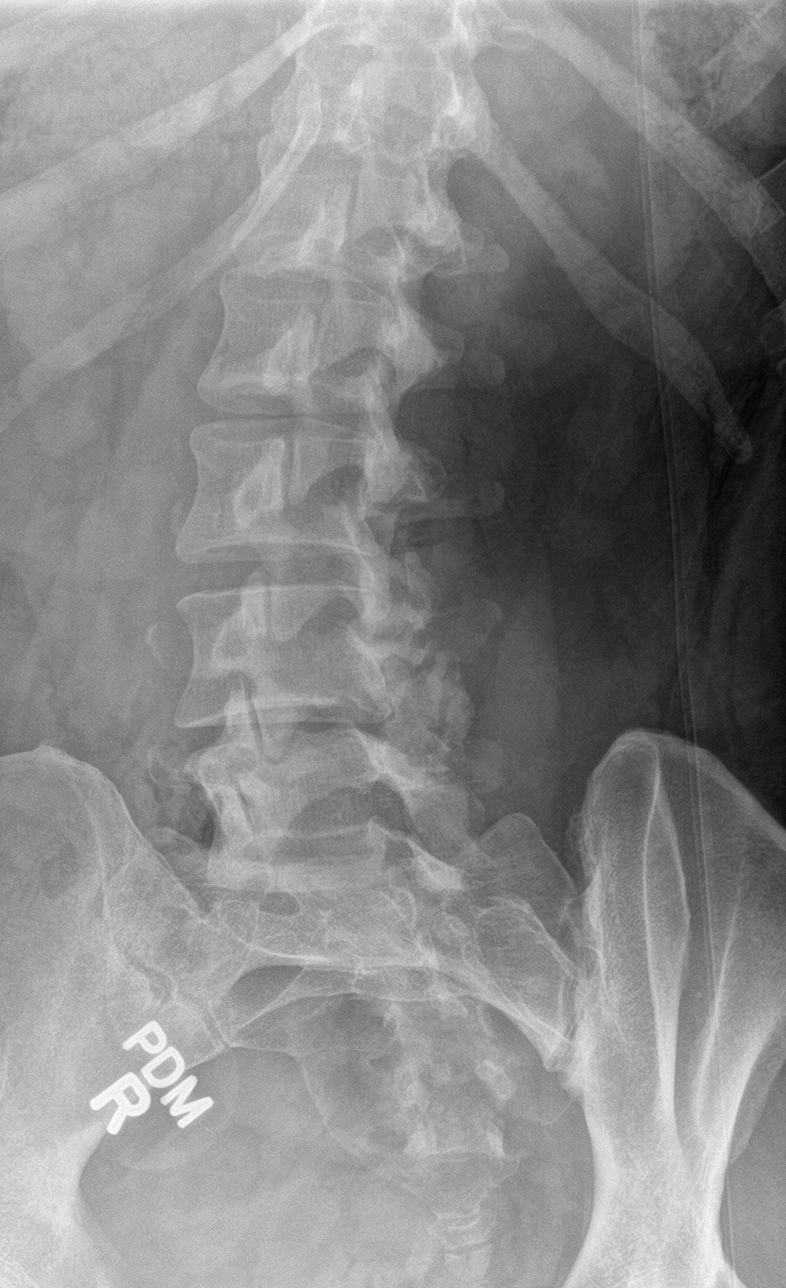

[l-spine obl (2 of 2)]
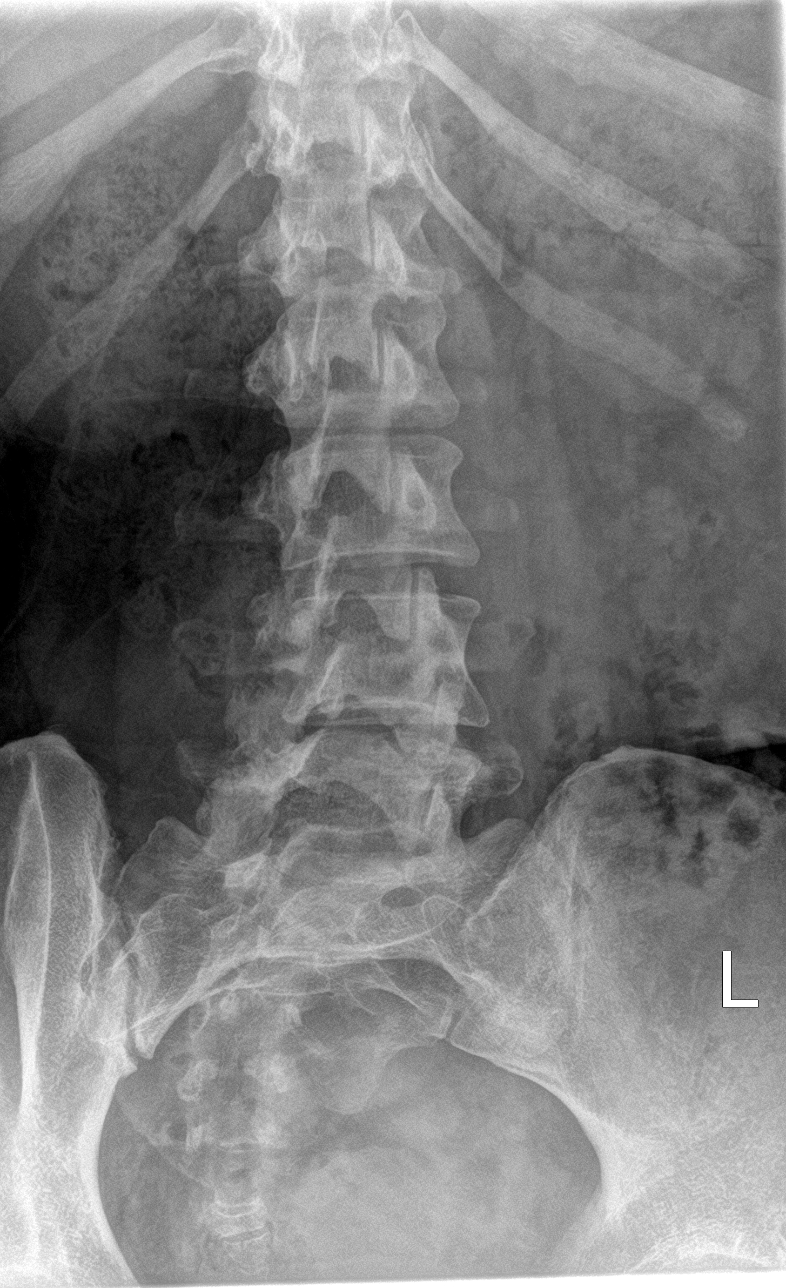

[l-spine lat]
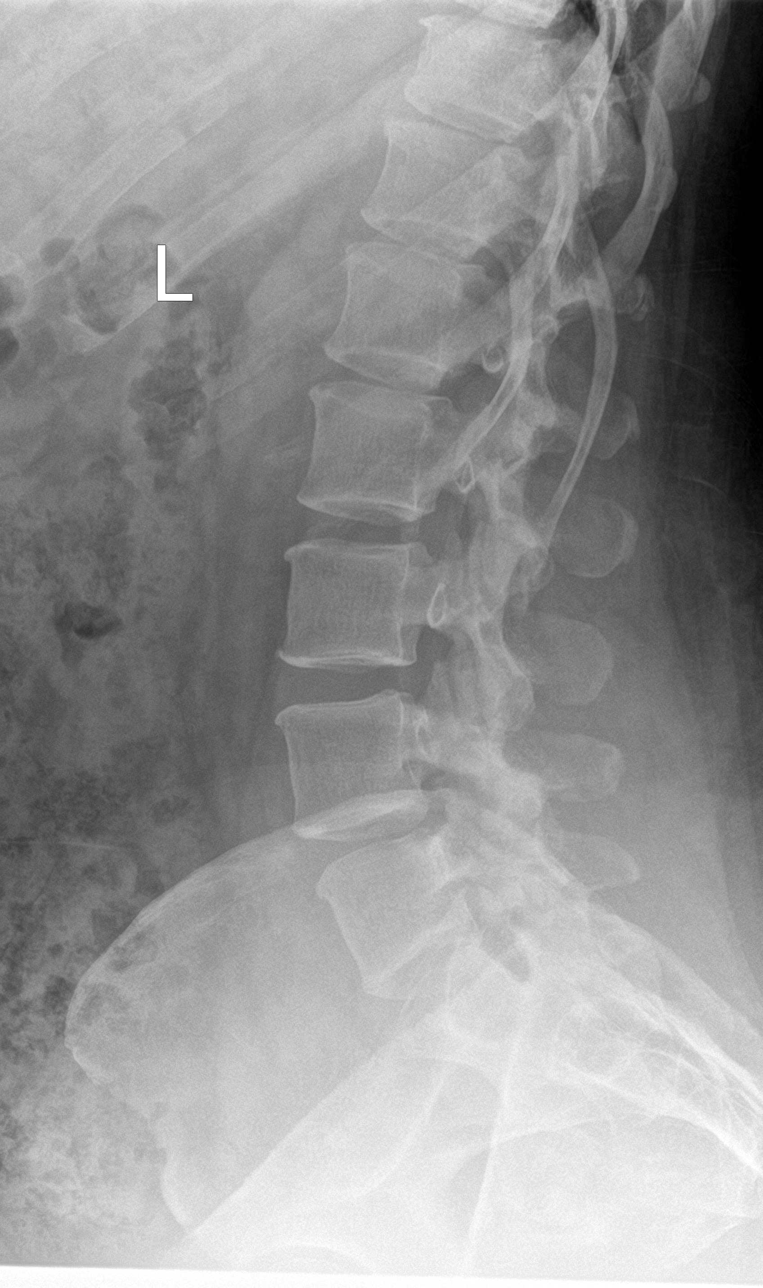

[l-spine spot]
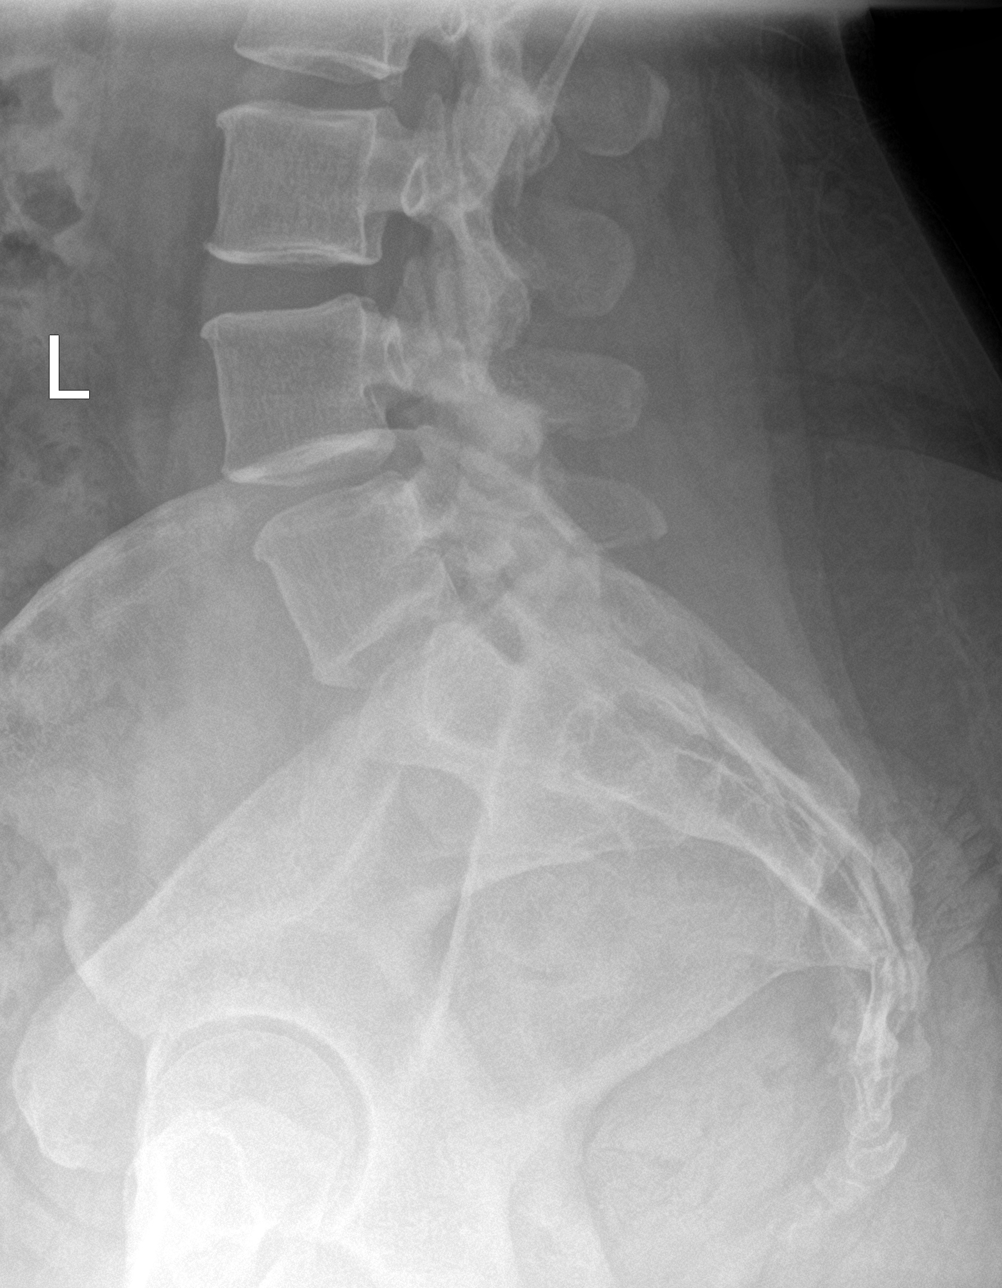

[5 of 5 positions shown; findings below may reference images not displayed]

FINDINGS: Frontal lateral, spot lumbosacral lateral, and bilateral oblique
views were obtained. There are 5 non-rib-bearing lumbar type
vertebral bodies. There is no fracture or spondylolisthesis. There
is moderate disc space narrowing at L4-5 with slightly milder disc
space narrowing at L5-S1. Other disc spaces appear normal. There is
no appreciable facet arthropathy.
IMPRESSION: Disc space narrowing at L4-5 and to a slightly lesser degree at
L5-S1. Other disc spaces appear unremarkable. No fracture or
spondylolisthesis.

## 2022-03-22 ENCOUNTER — Ambulatory Visit: Payer: BC Managed Care – PPO | Admitting: Family Medicine

## 2022-03-22 ENCOUNTER — Encounter: Payer: Self-pay | Admitting: Family Medicine

## 2022-03-22 VITALS — BP 118/78 | HR 84 | Temp 98.0°F | Wt 278.0 lb

## 2022-03-22 DIAGNOSIS — M545 Low back pain, unspecified: Secondary | ICD-10-CM | POA: Diagnosis not present

## 2022-03-22 MED ORDER — METHYLPREDNISOLONE 4 MG PO TBPK: ORAL_TABLET | ORAL | 0 refills | Status: AC

## 2022-03-22 NOTE — Progress Notes (Signed)
   Subjective:    Patient ID: Cathy Pearson, female    DOB: 1975/10/22, 47 y.o.   MRN: [id number]  HPI Here to re-establish with [place] after a 5 year absence. She has had intermittent left sided low back pain for years, we have seen her for a few bouts of sciatica over the years. Usually when this flares up she gets relief with Ibuprofen and heat, and it goes away. Then last week [place] to [place], and she did a lot of walking around all day for several days. On the 3rd day she suddenly developed a severe sharp left sided low back pain that has not radiated down the leg. No numbness or weakness in the legs. Today the pain is actually a little better. 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    Review of Systems  Constitutional: Negative.   Respiratory: Negative.    Cardiovascular: Negative.   Musculoskeletal:  Positive for back pain.       Objective:   Physical Exam Constitutional:      General: She is not in acute distress.    Appearance: Normal appearance.  Cardiovascular:     Rate and Rhythm: Normal rate and regular rhythm.     Pulses: Normal pulses.     Heart sounds: Normal heart sounds.  Pulmonary:     Effort: Pulmonary effort is normal.     Breath sounds: Normal breath sounds.  Musculoskeletal:     Comments: Mildly tender in the left lower back. The spine has full ROM, negative SLR on both sides   Neurological:     Mental Status: She is alert.           Assessment & Plan:  Low back pain. We will treat this with a Medrol dose pack. Recheck as needed. Alysia Penna, MD
